# Patient Record
Sex: Male | Born: 1940
Health system: Southern US, Community
[De-identification: ages and names within clinical notes are randomized; demographics above are authoritative.]

## PROBLEM LIST (undated history)

## (undated) DIAGNOSIS — N400 Enlarged prostate without lower urinary tract symptoms: Secondary | ICD-10-CM

## (undated) DIAGNOSIS — I10 Essential (primary) hypertension: Secondary | ICD-10-CM

## (undated) HISTORY — PX: HERNIA REPAIR: SHX51

## (undated) HISTORY — PX: APPENDECTOMY: SHX54

---

## 2017-02-19 DIAGNOSIS — H0014 Chalazion left upper eyelid: Secondary | ICD-10-CM | POA: Diagnosis not present

## 2017-03-11 DIAGNOSIS — Z961 Presence of intraocular lens: Secondary | ICD-10-CM | POA: Diagnosis not present

## 2017-03-11 DIAGNOSIS — H0014 Chalazion left upper eyelid: Secondary | ICD-10-CM | POA: Diagnosis not present

## 2017-03-11 DIAGNOSIS — H04123 Dry eye syndrome of bilateral lacrimal glands: Secondary | ICD-10-CM | POA: Diagnosis not present

## 2017-04-14 DIAGNOSIS — I10 Essential (primary) hypertension: Secondary | ICD-10-CM | POA: Diagnosis not present

## 2017-04-14 DIAGNOSIS — E538 Deficiency of other specified B group vitamins: Secondary | ICD-10-CM | POA: Diagnosis not present

## 2017-04-14 DIAGNOSIS — M7551 Bursitis of right shoulder: Secondary | ICD-10-CM | POA: Diagnosis not present

## 2017-04-14 DIAGNOSIS — Z23 Encounter for immunization: Secondary | ICD-10-CM | POA: Diagnosis not present

## 2017-04-18 DIAGNOSIS — H0014 Chalazion left upper eyelid: Secondary | ICD-10-CM | POA: Diagnosis not present

## 2017-04-18 DIAGNOSIS — Z961 Presence of intraocular lens: Secondary | ICD-10-CM | POA: Diagnosis not present

## 2017-04-18 DIAGNOSIS — H0015 Chalazion left lower eyelid: Secondary | ICD-10-CM | POA: Diagnosis not present

## 2017-04-18 DIAGNOSIS — H04123 Dry eye syndrome of bilateral lacrimal glands: Secondary | ICD-10-CM | POA: Diagnosis not present

## 2017-05-01 DIAGNOSIS — H04123 Dry eye syndrome of bilateral lacrimal glands: Secondary | ICD-10-CM | POA: Diagnosis not present

## 2017-05-01 DIAGNOSIS — Z961 Presence of intraocular lens: Secondary | ICD-10-CM | POA: Diagnosis not present

## 2017-05-01 DIAGNOSIS — H0014 Chalazion left upper eyelid: Secondary | ICD-10-CM | POA: Diagnosis not present

## 2017-05-01 DIAGNOSIS — H0015 Chalazion left lower eyelid: Secondary | ICD-10-CM | POA: Diagnosis not present

## 2017-05-24 ENCOUNTER — Emergency Department (HOSPITAL_BASED_OUTPATIENT_CLINIC_OR_DEPARTMENT_OTHER)
Admission: EM | Admit: 2017-05-24 | Discharge: 2017-05-24 | Disposition: A | Discharge status: Home / Self Care | Payer: PPO | Attending: Emergency Medicine | Admitting: Emergency Medicine

## 2017-05-24 ENCOUNTER — Encounter (HOSPITAL_BASED_OUTPATIENT_CLINIC_OR_DEPARTMENT_OTHER): Payer: Self-pay | Admitting: Emergency Medicine

## 2017-05-24 ENCOUNTER — Other Ambulatory Visit: Payer: Self-pay

## 2017-05-24 ENCOUNTER — Emergency Department (HOSPITAL_BASED_OUTPATIENT_CLINIC_OR_DEPARTMENT_OTHER): Payer: PPO

## 2017-05-24 DIAGNOSIS — Z87891 Personal history of nicotine dependence: Secondary | ICD-10-CM | POA: Diagnosis not present

## 2017-05-24 DIAGNOSIS — Y998 Other external cause status: Secondary | ICD-10-CM | POA: Insufficient documentation

## 2017-05-24 DIAGNOSIS — S6991XA Unspecified injury of right wrist, hand and finger(s), initial encounter: Secondary | ICD-10-CM | POA: Diagnosis present

## 2017-05-24 DIAGNOSIS — W28XXXA Contact with powered lawn mower, initial encounter: Secondary | ICD-10-CM | POA: Diagnosis not present

## 2017-05-24 DIAGNOSIS — I10 Essential (primary) hypertension: Secondary | ICD-10-CM | POA: Diagnosis not present

## 2017-05-24 DIAGNOSIS — Y939 Activity, unspecified: Secondary | ICD-10-CM | POA: Insufficient documentation

## 2017-05-24 DIAGNOSIS — Y929 Unspecified place or not applicable: Secondary | ICD-10-CM | POA: Diagnosis not present

## 2017-05-24 DIAGNOSIS — Z79899 Other long term (current) drug therapy: Secondary | ICD-10-CM | POA: Insufficient documentation

## 2017-05-24 DIAGNOSIS — Z23 Encounter for immunization: Secondary | ICD-10-CM | POA: Diagnosis not present

## 2017-05-24 DIAGNOSIS — S61212A Laceration without foreign body of right middle finger without damage to nail, initial encounter: Secondary | ICD-10-CM | POA: Diagnosis not present

## 2017-05-24 HISTORY — DX: Benign prostatic hyperplasia without lower urinary tract symptoms: N40.0

## 2017-05-24 HISTORY — DX: Essential (primary) hypertension: I10

## 2017-05-24 MED ORDER — HYDROCODONE-ACETAMINOPHEN 5-325 MG PO TABS: 1.0000 | ORAL_TABLET | Freq: Four times a day (QID) | ORAL | 0 refills | Status: AC | PRN

## 2017-05-24 MED ORDER — TETANUS-DIPHTH-ACELL PERTUSSIS 5-2.5-18.5 LF-MCG/0.5 IM SUSP
0.5000 mL | Freq: Once | INTRAMUSCULAR | Status: AC
  Administered 2017-05-24: 0.5 mL via INTRAMUSCULAR
  Filled 2017-05-24: qty 0.5

## 2017-05-24 MED ORDER — BUPIVACAINE HCL (PF) 0.5 % IJ SOLN
20.0000 mL | Freq: Once | INTRAMUSCULAR | Status: AC
  Administered 2017-05-24: 20 mL
  Filled 2017-05-24: qty 20

## 2017-05-24 MED ORDER — TETANUS-DIPHTH-ACELL PERTUSSIS 5-2.5-18.5 LF-MCG/0.5 IM SUSP: 0.5000 mL | Freq: Once | INTRAMUSCULAR | Status: DC

## 2017-05-24 MED ORDER — IBUPROFEN 600 MG PO TABS: 600.0000 mg | ORAL_TABLET | Freq: Four times a day (QID) | ORAL | 0 refills | Status: AC | PRN

## 2017-05-24 NOTE — ED Provider Notes (Signed)
[facility] EMERGENCY DEPARTMENT Provider Note   CSN: [id number] Arrival date & time: 05/24/17  1514     History   Chief Complaint Chief Complaint  Patient presents with  . Laceration    HPI Bradley Moody is a 76 y.o. male past medical history hypertension presenting with laceration injury to the middle right finger after reaching under his lawnmower prior to arrival. Bleeding controlled, patient denies any other injuries.  Patient tetanus status was not up-to-date  HPI  Past Medical History:  Diagnosis Date  . BPH (benign prostatic hyperplasia)   . Hypertension     There are no active problems to display for this patient.   Past Surgical History:  Procedure Laterality Date  . APPENDECTOMY    . HERNIA REPAIR         Home Medications    Prior to Admission medications   Medication Sig Start Date End Date Taking? Authorizing Provider  LISINOPRIL PO Take by mouth.   Yes [provider]  HYDROcodone-acetaminophen (NORCO/VICODIN) 5-325 MG tablet Take 1 tablet every 6 (six) hours as needed by mouth for severe pain. 05/24/17   Emeline General, PA-C  ibuprofen (ADVIL,MOTRIN) 600 MG tablet Take 1 tablet (600 mg total) every 6 (six) hours as needed by mouth. 05/24/17   Emeline General, PA-C    Family History No family history on file.  Social History Social History   Tobacco Use  . Smoking status: Former Research scientist (life sciences)  . Smokeless tobacco: Never Used  Substance Use Topics  . Alcohol use: No    Frequency: Never  . Drug use: No     Allergies   Patient has no known allergies.   Review of Systems Review of Systems  Constitutional: Negative for chills and fever.  Gastrointestinal: Negative for nausea and vomiting.  Musculoskeletal: Positive for myalgias. Negative for arthralgias, joint swelling, neck pain and neck stiffness.  Skin: Positive for wound. Negative for color change, pallor and rash.  Neurological: Negative for dizziness,  syncope, weakness, light-headedness and numbness.     Physical Exam Updated Vital Signs BP 100/63 (BP Location: Left Arm)   Pulse 64   Temp 98.3 F (36.8 C) (Oral)   Resp 18   Ht 5\' 9"  (1.753 m)   Wt 68 kg (150 lb)   SpO2 99%   BMI 22.15 kg/m   Physical Exam  Constitutional: He appears well-developed and well-nourished. No distress.  Afebrile, nontoxic-appearing, sitting comfortably and in no acute distress. Bleeding controlled.  HENT:  Head: Normocephalic and atraumatic.  Eyes: EOM are normal.  Neck: Normal range of motion.  Cardiovascular: Normal rate, regular rhythm, normal heart sounds and intact distal pulses.  No murmur heard. Pulmonary/Chest: Effort normal and breath sounds normal. No stridor. No respiratory distress. He has no wheezes.  Musculoskeletal: Normal range of motion. He exhibits tenderness and deformity. He exhibits no edema.  Full range of motion of the finger.  Neurological: He is alert. No sensory deficit.  Skin: Skin is warm and dry. He is not diaphoretic. No pallor.  Stellate macerated laceration to the distal middle right finger. Patient still has sensation but not on the skin flap at the most distal point.  Psychiatric: He has a normal mood and affect.  Nursing note and vitals reviewed.        ED Treatments / Results  Labs (all labs ordered are listed, but only abnormal results are displayed) Labs Reviewed - No data to display  EKG  EKG Interpretation  None       Radiology Dg Finger Middle Right  Result Date: 05/24/2017 CLINICAL DATA:  Right middle finger laceration from lawnmower blade. EXAM: RIGHT MIDDLE FINGER 2+V COMPARISON:  None. FINDINGS: Soft tissue laceration at the tip of the middle finger. No radiopaque foreign body. No underlying osseous abnormality. Mild degenerative changes of the DIP joint. IMPRESSION: Soft tissue laceration at the tip of the middle finger. No radiopaque foreign body. No acute osseous abnormality.  Electronically Signed   By: Titus Dubin M.D.   On: 05/24/2017 17:02    Procedures Procedures (including critical care time)  NERVE BLOCK Performed by: Emeline General Consent: Verbal consent obtained. Required items: required blood products, implants, devices, and special equipment available Time out: Immediately prior to procedure a "time out" was called to verify the correct patient, procedure, equipment, support staff and site/side marked as required.  Indication: laceration repair Nerve block body site: right middle finger  Preparation: Patient was prepped and draped in the usual sterile fashion. Needle gauge: 24 G Location technique: anatomical landmarks  Local anesthetic: marcaine  Anesthetic total: 4 ml  Outcome: pain improved Patient tolerance: Patient tolerated the procedure well with no immediate complications.  LACERATION REPAIR Performed by: Emeline General Authorized by: Emeline General Consent: Verbal consent obtained. Risks and benefits: risks, benefits and alternatives were discussed Consent given by: patient Patient identity confirmed: provided demographic data Prepped and Draped in normal sterile fashion Wound explored  Laceration Location: right middle finger  Laceration Length: 2cm macerated see picture  No Foreign Bodies seen or palpated  Anesthesia: local infiltration digital block  Local anesthetic: marcaine 0.5%  Anesthetic total: 4 ml  Irrigation method: syringe Amount of cleaning: standard  Skin closure: 5.0 prolene  Number of sutures: 10  Technique: stellate corner (2), simple interrupted (8)  Patient tolerance: Patient tolerated the procedure well with no immediate complications. Medications Ordered in ED Medications  Tdap (BOOSTRIX) injection 0.5 mL (not administered)  Tdap (BOOSTRIX) injection 0.5 mL (0.5 mLs Intramuscular Given 05/24/17 1605)  bupivacaine (MARCAINE) 0.5 % injection 20 mL (20 mLs Infiltration  Given 05/24/17 1634)     Initial Impression / Assessment and Plan / ED Course  I have reviewed the triage vital signs and the nursing notes.  Pertinent labs & imaging results that were available during my care of the patient were reviewed by me and considered in my medical decision making (see chart for details).    Pressure irrigation performed. Wound explored and base of wound visualized in a bloodless field without evidence of foreign body.  Laceration occurred < 8 hours prior to repair which was well tolerated. Tdap updated.  Pt has no comorbidities to effect normal wound healing. Pt discharged  without antibiotics.    Discussed suture home care with patient and answered questions. Pt to follow-up for wound check and suture removal in 7 days; they are to return to the ED sooner for signs of infection. Pt is hemodynamically stable with no complaints prior to dc.   Discharge home with symptomatic relief and close follow-up with PCP. Discussed strict return precautions and advised to return to the emergency department if experiencing any new or worsening symptoms. Instructions were understood and patient agreed with discharge plan.  Final Clinical Impressions(s) / ED Diagnoses   Final diagnoses:  Laceration of right middle finger without foreign body without damage to nail, initial encounter    ED Discharge Orders        Ordered  HYDROcodone-acetaminophen (NORCO/VICODIN) 5-325 MG tablet  Every 6 hours PRN     05/24/17 1813    ibuprofen (ADVIL,MOTRIN) 600 MG tablet  Every 6 hours PRN     05/24/17 1813       Emeline General, PA-C 05/24/17 1817    Charlesetta Shanks, MD 05/25/17 [phone]

## 2017-05-24 NOTE — ED Notes (Addendum)
Pt given d/c instructions as per chart. Rx x 2 with precautions. Verbalizes understanding. No questions. 

## 2017-05-24 NOTE — Discharge Instructions (Signed)
As discussed, take ibuprofen for pain and Vicodin only for breakthrough severe pain. Keep the area clean and dry and monitor for any signs of infection.  Sutures will need to be removed in 7-10 days. Follow up with your primary care provider for suture removal.  Return sooner if pain increases, swelling, redness, warmth, purulence, fever, chills, or other new concerning symptoms in the meantime.

## 2017-05-24 NOTE — ED Triage Notes (Signed)
Pt c/o lac to RT middle finger via lawnmower blade today just PTA

## 2017-05-24 NOTE — ED Notes (Signed)
Laceration to tip of right middle finger is noted.  Cleanse with Saf-Cleans.  No active bleeding noted. Unable to measure site.

## 2017-05-28 DIAGNOSIS — Z5189 Encounter for other specified aftercare: Secondary | ICD-10-CM | POA: Diagnosis not present

## 2017-06-02 DIAGNOSIS — Z4802 Encounter for removal of sutures: Secondary | ICD-10-CM | POA: Diagnosis not present

## 2017-06-03 DIAGNOSIS — T8133XA Disruption of traumatic injury wound repair, initial encounter: Secondary | ICD-10-CM | POA: Diagnosis not present

## 2017-06-05 DIAGNOSIS — Z961 Presence of intraocular lens: Secondary | ICD-10-CM | POA: Diagnosis not present

## 2017-06-05 DIAGNOSIS — H0015 Chalazion left lower eyelid: Secondary | ICD-10-CM | POA: Diagnosis not present

## 2017-06-05 DIAGNOSIS — H0014 Chalazion left upper eyelid: Secondary | ICD-10-CM | POA: Diagnosis not present

## 2017-06-05 DIAGNOSIS — H04123 Dry eye syndrome of bilateral lacrimal glands: Secondary | ICD-10-CM | POA: Diagnosis not present

## 2017-06-10 DIAGNOSIS — Z5189 Encounter for other specified aftercare: Secondary | ICD-10-CM | POA: Diagnosis not present

## 2017-09-04 DIAGNOSIS — Z1211 Encounter for screening for malignant neoplasm of colon: Secondary | ICD-10-CM | POA: Diagnosis not present

## 2017-09-04 DIAGNOSIS — Z8371 Family history of colonic polyps: Secondary | ICD-10-CM | POA: Diagnosis not present

## 2017-09-04 DIAGNOSIS — D123 Benign neoplasm of transverse colon: Secondary | ICD-10-CM | POA: Diagnosis not present

## 2017-09-04 DIAGNOSIS — Z86010 Personal history of colonic polyps: Secondary | ICD-10-CM | POA: Diagnosis not present

## 2017-09-04 DIAGNOSIS — K5730 Diverticulosis of large intestine without perforation or abscess without bleeding: Secondary | ICD-10-CM | POA: Diagnosis not present

## 2017-09-04 DIAGNOSIS — K648 Other hemorrhoids: Secondary | ICD-10-CM | POA: Diagnosis not present

## 2017-10-31 DIAGNOSIS — I10 Essential (primary) hypertension: Secondary | ICD-10-CM | POA: Diagnosis not present

## 2017-10-31 DIAGNOSIS — E785 Hyperlipidemia, unspecified: Secondary | ICD-10-CM | POA: Diagnosis not present

## 2017-10-31 DIAGNOSIS — E538 Deficiency of other specified B group vitamins: Secondary | ICD-10-CM | POA: Diagnosis not present

## 2017-11-04 DIAGNOSIS — R972 Elevated prostate specific antigen [PSA]: Secondary | ICD-10-CM | POA: Diagnosis not present

## 2017-11-04 DIAGNOSIS — E785 Hyperlipidemia, unspecified: Secondary | ICD-10-CM | POA: Diagnosis not present

## 2017-11-04 DIAGNOSIS — N183 Chronic kidney disease, stage 3 (moderate): Secondary | ICD-10-CM | POA: Diagnosis not present

## 2017-11-04 DIAGNOSIS — D519 Vitamin B12 deficiency anemia, unspecified: Secondary | ICD-10-CM | POA: Diagnosis not present

## 2017-11-04 DIAGNOSIS — I129 Hypertensive chronic kidney disease with stage 1 through stage 4 chronic kidney disease, or unspecified chronic kidney disease: Secondary | ICD-10-CM | POA: Diagnosis not present

## 2017-11-04 DIAGNOSIS — R634 Abnormal weight loss: Secondary | ICD-10-CM | POA: Diagnosis not present

## 2017-12-01 DIAGNOSIS — H35363 Drusen (degenerative) of macula, bilateral: Secondary | ICD-10-CM | POA: Diagnosis not present

## 2017-12-01 DIAGNOSIS — H02883 Meibomian gland dysfunction of right eye, unspecified eyelid: Secondary | ICD-10-CM | POA: Diagnosis not present

## 2018-01-15 DIAGNOSIS — N183 Chronic kidney disease, stage 3 (moderate): Secondary | ICD-10-CM | POA: Diagnosis not present

## 2018-01-15 DIAGNOSIS — E785 Hyperlipidemia, unspecified: Secondary | ICD-10-CM | POA: Diagnosis not present

## 2018-01-15 DIAGNOSIS — Z0000 Encounter for general adult medical examination without abnormal findings: Secondary | ICD-10-CM | POA: Diagnosis not present

## 2018-01-15 DIAGNOSIS — D519 Vitamin B12 deficiency anemia, unspecified: Secondary | ICD-10-CM | POA: Diagnosis not present

## 2018-01-15 DIAGNOSIS — I129 Hypertensive chronic kidney disease with stage 1 through stage 4 chronic kidney disease, or unspecified chronic kidney disease: Secondary | ICD-10-CM | POA: Diagnosis not present

## 2018-03-20 DIAGNOSIS — L570 Actinic keratosis: Secondary | ICD-10-CM | POA: Diagnosis not present

## 2018-03-20 DIAGNOSIS — C44519 Basal cell carcinoma of skin of other part of trunk: Secondary | ICD-10-CM | POA: Diagnosis not present

## 2018-03-20 DIAGNOSIS — D225 Melanocytic nevi of trunk: Secondary | ICD-10-CM | POA: Diagnosis not present

## 2018-03-20 DIAGNOSIS — Z1283 Encounter for screening for malignant neoplasm of skin: Secondary | ICD-10-CM | POA: Diagnosis not present

## 2018-03-20 DIAGNOSIS — X32XXXD Exposure to sunlight, subsequent encounter: Secondary | ICD-10-CM | POA: Diagnosis not present

## 2018-03-26 DIAGNOSIS — K5792 Diverticulitis of intestine, part unspecified, without perforation or abscess without bleeding: Secondary | ICD-10-CM | POA: Diagnosis not present

## 2018-03-26 DIAGNOSIS — N420 Calculus of prostate: Secondary | ICD-10-CM | POA: Diagnosis not present

## 2018-03-26 DIAGNOSIS — N400 Enlarged prostate without lower urinary tract symptoms: Secondary | ICD-10-CM | POA: Diagnosis not present

## 2018-03-26 DIAGNOSIS — K5730 Diverticulosis of large intestine without perforation or abscess without bleeding: Secondary | ICD-10-CM | POA: Diagnosis not present

## 2018-03-26 DIAGNOSIS — R1110 Vomiting, unspecified: Secondary | ICD-10-CM | POA: Diagnosis not present

## 2018-03-26 DIAGNOSIS — R197 Diarrhea, unspecified: Secondary | ICD-10-CM | POA: Diagnosis not present

## 2018-03-26 DIAGNOSIS — N2889 Other specified disorders of kidney and ureter: Secondary | ICD-10-CM | POA: Diagnosis not present

## 2018-03-26 DIAGNOSIS — N3289 Other specified disorders of bladder: Secondary | ICD-10-CM | POA: Diagnosis not present

## 2018-03-26 DIAGNOSIS — K5732 Diverticulitis of large intestine without perforation or abscess without bleeding: Secondary | ICD-10-CM | POA: Diagnosis not present

## 2018-03-26 DIAGNOSIS — R1032 Left lower quadrant pain: Secondary | ICD-10-CM | POA: Diagnosis not present

## 2018-03-27 DIAGNOSIS — N179 Acute kidney failure, unspecified: Secondary | ICD-10-CM | POA: Diagnosis not present

## 2018-03-27 DIAGNOSIS — K5792 Diverticulitis of intestine, part unspecified, without perforation or abscess without bleeding: Secondary | ICD-10-CM | POA: Diagnosis not present

## 2018-03-27 DIAGNOSIS — I959 Hypotension, unspecified: Secondary | ICD-10-CM | POA: Diagnosis not present

## 2018-03-27 DIAGNOSIS — N183 Chronic kidney disease, stage 3 (moderate): Secondary | ICD-10-CM | POA: Diagnosis not present

## 2018-03-27 DIAGNOSIS — N281 Cyst of kidney, acquired: Secondary | ICD-10-CM | POA: Diagnosis not present

## 2018-03-27 DIAGNOSIS — N2889 Other specified disorders of kidney and ureter: Secondary | ICD-10-CM | POA: Diagnosis not present

## 2018-03-27 DIAGNOSIS — Z87891 Personal history of nicotine dependence: Secondary | ICD-10-CM | POA: Diagnosis not present

## 2018-03-27 DIAGNOSIS — N400 Enlarged prostate without lower urinary tract symptoms: Secondary | ICD-10-CM | POA: Diagnosis not present

## 2018-03-27 DIAGNOSIS — A029 Salmonella infection, unspecified: Secondary | ICD-10-CM | POA: Diagnosis not present

## 2018-03-27 DIAGNOSIS — E785 Hyperlipidemia, unspecified: Secondary | ICD-10-CM | POA: Diagnosis not present

## 2018-03-27 DIAGNOSIS — R531 Weakness: Secondary | ICD-10-CM | POA: Diagnosis not present

## 2018-03-27 DIAGNOSIS — E861 Hypovolemia: Secondary | ICD-10-CM | POA: Diagnosis not present

## 2018-03-27 DIAGNOSIS — E871 Hypo-osmolality and hyponatremia: Secondary | ICD-10-CM | POA: Diagnosis not present

## 2018-03-27 DIAGNOSIS — Z6820 Body mass index (BMI) 20.0-20.9, adult: Secondary | ICD-10-CM | POA: Diagnosis not present

## 2018-03-27 DIAGNOSIS — R197 Diarrhea, unspecified: Secondary | ICD-10-CM | POA: Diagnosis not present

## 2018-03-27 DIAGNOSIS — R1032 Left lower quadrant pain: Secondary | ICD-10-CM | POA: Diagnosis not present

## 2018-03-27 DIAGNOSIS — K5732 Diverticulitis of large intestine without perforation or abscess without bleeding: Secondary | ICD-10-CM | POA: Diagnosis not present

## 2018-03-27 DIAGNOSIS — Z833 Family history of diabetes mellitus: Secondary | ICD-10-CM | POA: Diagnosis not present

## 2018-03-27 DIAGNOSIS — R634 Abnormal weight loss: Secondary | ICD-10-CM | POA: Diagnosis not present

## 2018-03-27 DIAGNOSIS — Z820 Family history of epilepsy and other diseases of the nervous system: Secondary | ICD-10-CM | POA: Diagnosis not present

## 2018-03-27 DIAGNOSIS — I10 Essential (primary) hypertension: Secondary | ICD-10-CM | POA: Diagnosis not present

## 2018-03-27 DIAGNOSIS — I129 Hypertensive chronic kidney disease with stage 1 through stage 4 chronic kidney disease, or unspecified chronic kidney disease: Secondary | ICD-10-CM | POA: Diagnosis not present

## 2018-03-31 DIAGNOSIS — N179 Acute kidney failure, unspecified: Secondary | ICD-10-CM | POA: Diagnosis not present

## 2018-03-31 DIAGNOSIS — I129 Hypertensive chronic kidney disease with stage 1 through stage 4 chronic kidney disease, or unspecified chronic kidney disease: Secondary | ICD-10-CM | POA: Diagnosis not present

## 2018-03-31 DIAGNOSIS — N183 Chronic kidney disease, stage 3 (moderate): Secondary | ICD-10-CM | POA: Diagnosis not present

## 2018-03-31 DIAGNOSIS — K5732 Diverticulitis of large intestine without perforation or abscess without bleeding: Secondary | ICD-10-CM | POA: Diagnosis not present

## 2018-03-31 DIAGNOSIS — A029 Salmonella infection, unspecified: Secondary | ICD-10-CM | POA: Diagnosis not present

## 2018-03-31 DIAGNOSIS — N2889 Other specified disorders of kidney and ureter: Secondary | ICD-10-CM | POA: Diagnosis not present

## 2018-03-31 DIAGNOSIS — Z23 Encounter for immunization: Secondary | ICD-10-CM | POA: Diagnosis not present

## 2018-03-31 DIAGNOSIS — D649 Anemia, unspecified: Secondary | ICD-10-CM | POA: Diagnosis not present

## 2018-04-01 ENCOUNTER — Other Ambulatory Visit: Payer: Self-pay

## 2018-04-01 NOTE — Patient Outreach (Addendum)
[facility]) Care Management  04/01/2018  Bradley Moody 04-23-1941 [id number]     Transition of Care Referral  Referral Date: 04/01/18 Referral Source: HTA Discharge Report Date of Admission: unknown Diagnosis: unknown Date of Discharge: 03/30/18 Facility: [facility] Insurance: HTA    Outreach attempt # 1 to patient.  Spoke with spouse who voices that patient is doing "great." She states that he is currently out of town on vacation. He was cleared by MD to be able to travel and take a vacation. Spouse confirmed that patient has all his meds. His follow up appts are in place.  They are aware of how to contact MD for any changes in condition. RN C< unable to complete full TOC assessment. Spouse apprecative of call but voices patient doing well and has no needs or concerns at this time.     Plan: RN CM will close case at this time.    Enzo Montgomery, RN,BSN,CCM [place] Management Telephonic Care Management Coordinator Direct Phone: [phone] Toll Free: [phone] Fax: [phone]

## 2018-04-14 DIAGNOSIS — N2889 Other specified disorders of kidney and ureter: Secondary | ICD-10-CM | POA: Diagnosis not present

## 2018-04-14 DIAGNOSIS — N281 Cyst of kidney, acquired: Secondary | ICD-10-CM | POA: Diagnosis not present

## 2018-05-11 DIAGNOSIS — N400 Enlarged prostate without lower urinary tract symptoms: Secondary | ICD-10-CM | POA: Diagnosis not present

## 2018-05-11 DIAGNOSIS — N420 Calculus of prostate: Secondary | ICD-10-CM | POA: Diagnosis not present

## 2018-06-03 DIAGNOSIS — I129 Hypertensive chronic kidney disease with stage 1 through stage 4 chronic kidney disease, or unspecified chronic kidney disease: Secondary | ICD-10-CM | POA: Diagnosis not present

## 2018-06-03 DIAGNOSIS — I10 Essential (primary) hypertension: Secondary | ICD-10-CM | POA: Diagnosis not present

## 2018-06-03 DIAGNOSIS — E538 Deficiency of other specified B group vitamins: Secondary | ICD-10-CM | POA: Diagnosis not present

## 2018-06-03 DIAGNOSIS — I959 Hypotension, unspecified: Secondary | ICD-10-CM | POA: Diagnosis not present

## 2018-06-03 DIAGNOSIS — D649 Anemia, unspecified: Secondary | ICD-10-CM | POA: Diagnosis not present

## 2018-06-03 DIAGNOSIS — E785 Hyperlipidemia, unspecified: Secondary | ICD-10-CM | POA: Diagnosis not present

## 2018-06-03 DIAGNOSIS — N183 Chronic kidney disease, stage 3 (moderate): Secondary | ICD-10-CM | POA: Diagnosis not present

## 2018-06-03 DIAGNOSIS — R413 Other amnesia: Secondary | ICD-10-CM | POA: Diagnosis not present

## 2018-06-25 DIAGNOSIS — M542 Cervicalgia: Secondary | ICD-10-CM | POA: Diagnosis not present

## 2018-06-25 DIAGNOSIS — N183 Chronic kidney disease, stage 3 (moderate): Secondary | ICD-10-CM | POA: Diagnosis not present

## 2018-07-14 DIAGNOSIS — L570 Actinic keratosis: Secondary | ICD-10-CM | POA: Diagnosis not present

## 2018-07-14 DIAGNOSIS — Z85828 Personal history of other malignant neoplasm of skin: Secondary | ICD-10-CM | POA: Diagnosis not present

## 2018-07-14 DIAGNOSIS — Z08 Encounter for follow-up examination after completed treatment for malignant neoplasm: Secondary | ICD-10-CM | POA: Diagnosis not present

## 2018-07-14 DIAGNOSIS — X32XXXD Exposure to sunlight, subsequent encounter: Secondary | ICD-10-CM | POA: Diagnosis not present

## 2018-09-18 DIAGNOSIS — R4189 Other symptoms and signs involving cognitive functions and awareness: Secondary | ICD-10-CM | POA: Diagnosis not present

## 2018-09-19 DIAGNOSIS — R4189 Other symptoms and signs involving cognitive functions and awareness: Secondary | ICD-10-CM | POA: Diagnosis not present

## 2018-09-19 DIAGNOSIS — I10 Essential (primary) hypertension: Secondary | ICD-10-CM | POA: Diagnosis not present

## 2018-09-19 DIAGNOSIS — E785 Hyperlipidemia, unspecified: Secondary | ICD-10-CM | POA: Diagnosis not present

## 2018-09-19 DIAGNOSIS — E538 Deficiency of other specified B group vitamins: Secondary | ICD-10-CM | POA: Diagnosis not present

## 2018-10-07 DIAGNOSIS — R4189 Other symptoms and signs involving cognitive functions and awareness: Secondary | ICD-10-CM | POA: Diagnosis not present

## 2018-10-07 DIAGNOSIS — R413 Other amnesia: Secondary | ICD-10-CM | POA: Diagnosis not present

## 2018-10-07 DIAGNOSIS — R6883 Chills (without fever): Secondary | ICD-10-CM | POA: Diagnosis not present

## 2018-10-07 DIAGNOSIS — R5383 Other fatigue: Secondary | ICD-10-CM | POA: Diagnosis not present

## 2018-10-07 DIAGNOSIS — R918 Other nonspecific abnormal finding of lung field: Secondary | ICD-10-CM | POA: Diagnosis not present

## 2018-10-07 DIAGNOSIS — J9811 Atelectasis: Secondary | ICD-10-CM | POA: Diagnosis not present

## 2018-10-07 DIAGNOSIS — G319 Degenerative disease of nervous system, unspecified: Secondary | ICD-10-CM | POA: Diagnosis not present

## 2018-10-27 DIAGNOSIS — F09 Unspecified mental disorder due to known physiological condition: Secondary | ICD-10-CM | POA: Diagnosis not present

## 2018-12-23 DIAGNOSIS — R634 Abnormal weight loss: Secondary | ICD-10-CM | POA: Diagnosis not present

## 2018-12-23 DIAGNOSIS — F09 Unspecified mental disorder due to known physiological condition: Secondary | ICD-10-CM | POA: Diagnosis not present

## 2018-12-23 DIAGNOSIS — N183 Chronic kidney disease, stage 3 (moderate): Secondary | ICD-10-CM | POA: Diagnosis not present

## 2018-12-23 DIAGNOSIS — I129 Hypertensive chronic kidney disease with stage 1 through stage 4 chronic kidney disease, or unspecified chronic kidney disease: Secondary | ICD-10-CM | POA: Diagnosis not present

## 2018-12-23 DIAGNOSIS — E785 Hyperlipidemia, unspecified: Secondary | ICD-10-CM | POA: Diagnosis not present

## 2019-01-09 IMAGING — DX DG FINGER MIDDLE 2+V*R*
3 series · 3 of 3 positions shown · non-contrast
Comparison: None.

CLINICAL DATA: Right middle finger laceration from lawnmower blade.

EXAM:
RIGHT MIDDLE FINGER 2+V

[finger ap]
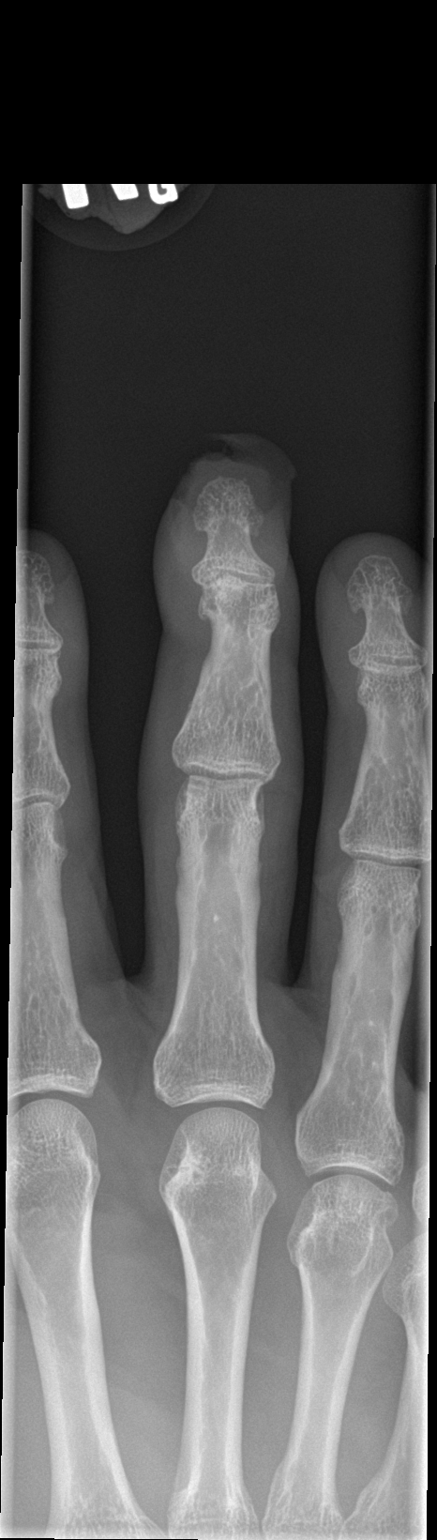

[finger obl]
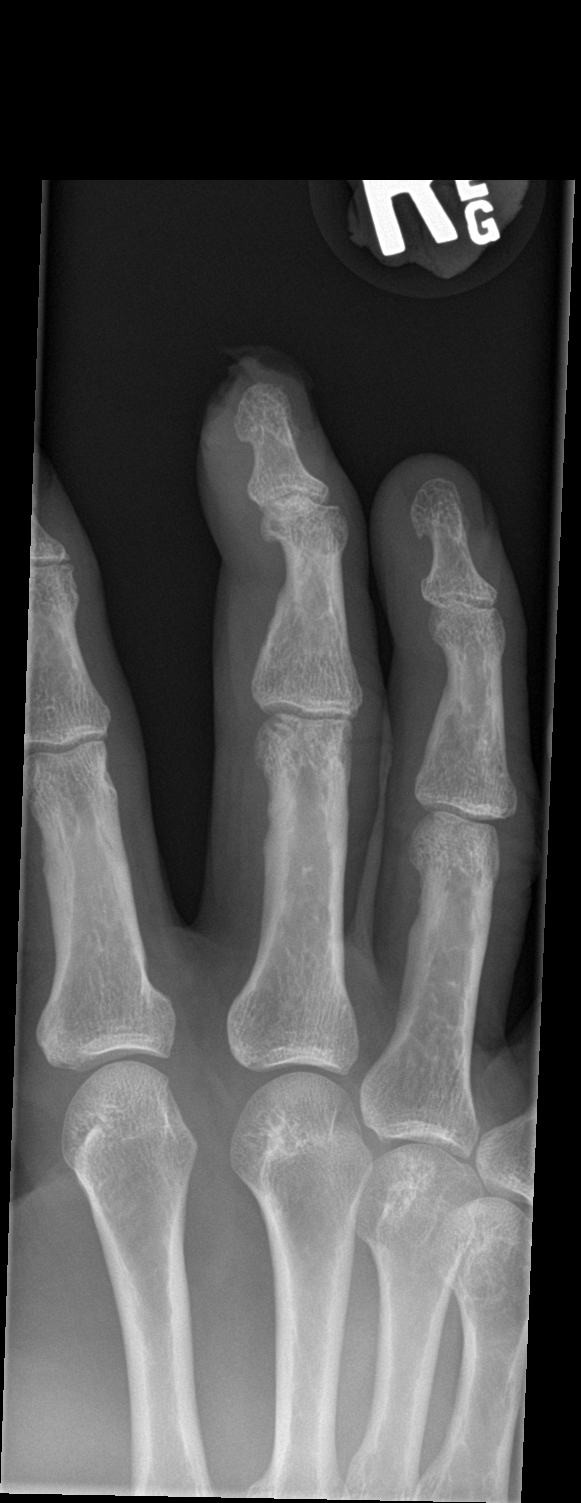

[finger lat]
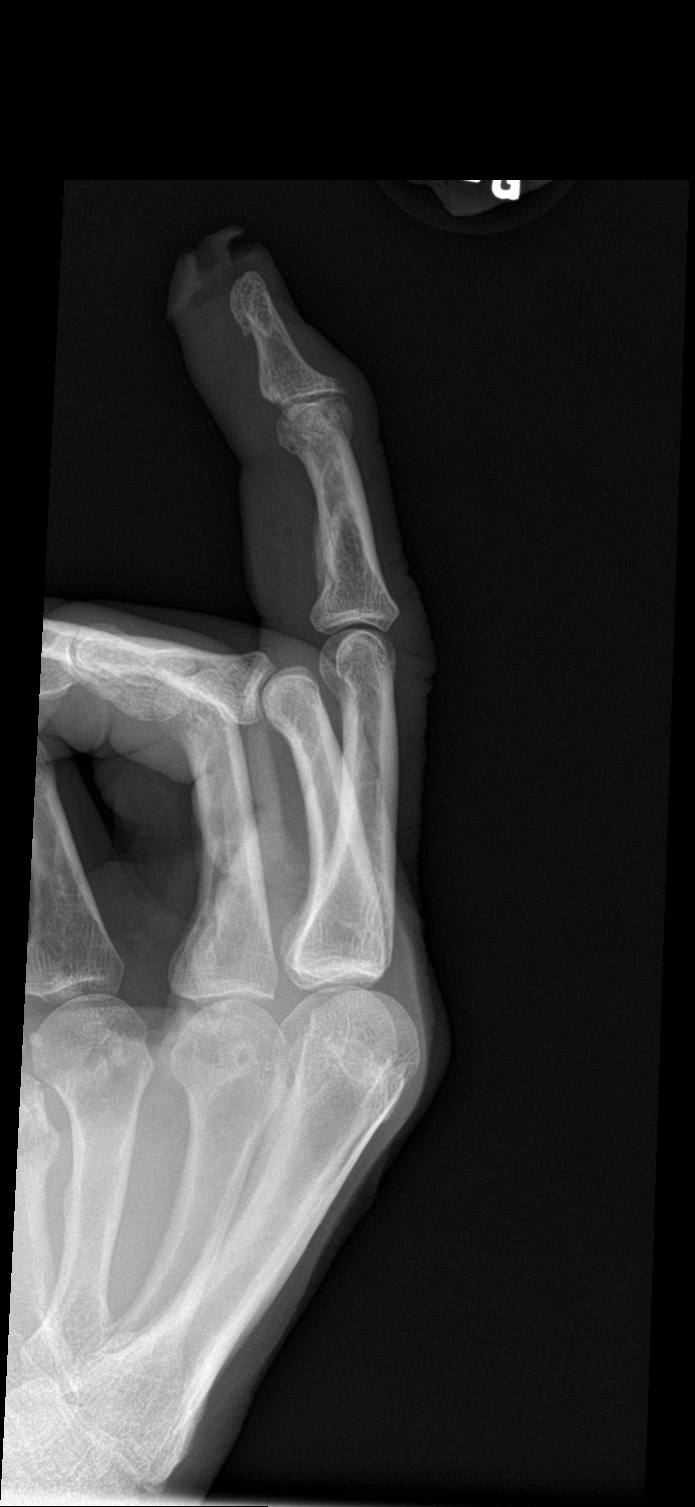

[3 of 3 positions shown; findings below may reference images not displayed]

FINDINGS: Soft tissue laceration at the tip of the middle finger. No
radiopaque foreign body. No underlying osseous abnormality. Mild
degenerative changes of the DIP joint.
IMPRESSION: Soft tissue laceration at the tip of the middle finger. No
radiopaque foreign body. No acute osseous abnormality.

## 2019-04-06 DIAGNOSIS — M7551 Bursitis of right shoulder: Secondary | ICD-10-CM | POA: Diagnosis not present

## 2019-04-06 DIAGNOSIS — R634 Abnormal weight loss: Secondary | ICD-10-CM | POA: Diagnosis not present

## 2019-04-06 DIAGNOSIS — F09 Unspecified mental disorder due to known physiological condition: Secondary | ICD-10-CM | POA: Diagnosis not present

## 2019-04-06 DIAGNOSIS — Z23 Encounter for immunization: Secondary | ICD-10-CM | POA: Diagnosis not present

## 2019-04-13 DIAGNOSIS — M7551 Bursitis of right shoulder: Secondary | ICD-10-CM | POA: Diagnosis not present

## 2019-05-05 DIAGNOSIS — N400 Enlarged prostate without lower urinary tract symptoms: Secondary | ICD-10-CM | POA: Diagnosis not present

## 2019-05-12 DIAGNOSIS — N400 Enlarged prostate without lower urinary tract symptoms: Secondary | ICD-10-CM | POA: Diagnosis not present

## 2019-07-13 DIAGNOSIS — E785 Hyperlipidemia, unspecified: Secondary | ICD-10-CM | POA: Diagnosis not present

## 2019-07-13 DIAGNOSIS — N1831 Chronic kidney disease, stage 3a: Secondary | ICD-10-CM | POA: Diagnosis not present

## 2019-07-13 DIAGNOSIS — E538 Deficiency of other specified B group vitamins: Secondary | ICD-10-CM | POA: Diagnosis not present

## 2019-07-15 DIAGNOSIS — E785 Hyperlipidemia, unspecified: Secondary | ICD-10-CM | POA: Diagnosis not present

## 2019-07-15 DIAGNOSIS — Z0000 Encounter for general adult medical examination without abnormal findings: Secondary | ICD-10-CM | POA: Diagnosis not present

## 2019-07-15 DIAGNOSIS — Z961 Presence of intraocular lens: Secondary | ICD-10-CM | POA: Diagnosis not present

## 2019-07-15 DIAGNOSIS — I700 Atherosclerosis of aorta: Secondary | ICD-10-CM | POA: Diagnosis not present

## 2019-07-15 DIAGNOSIS — R634 Abnormal weight loss: Secondary | ICD-10-CM | POA: Diagnosis not present

## 2019-07-15 DIAGNOSIS — I129 Hypertensive chronic kidney disease with stage 1 through stage 4 chronic kidney disease, or unspecified chronic kidney disease: Secondary | ICD-10-CM | POA: Diagnosis not present

## 2019-07-15 DIAGNOSIS — R4181 Age-related cognitive decline: Secondary | ICD-10-CM | POA: Diagnosis not present

## 2019-07-15 DIAGNOSIS — H04123 Dry eye syndrome of bilateral lacrimal glands: Secondary | ICD-10-CM | POA: Diagnosis not present

## 2019-07-15 DIAGNOSIS — N2889 Other specified disorders of kidney and ureter: Secondary | ICD-10-CM | POA: Diagnosis not present

## 2019-07-15 DIAGNOSIS — E559 Vitamin D deficiency, unspecified: Secondary | ICD-10-CM | POA: Diagnosis not present

## 2019-07-15 DIAGNOSIS — E538 Deficiency of other specified B group vitamins: Secondary | ICD-10-CM | POA: Diagnosis not present

## 2019-07-15 DIAGNOSIS — H0100A Unspecified blepharitis right eye, upper and lower eyelids: Secondary | ICD-10-CM | POA: Diagnosis not present

## 2019-07-15 DIAGNOSIS — N1831 Chronic kidney disease, stage 3a: Secondary | ICD-10-CM | POA: Diagnosis not present

## 2019-08-03 DIAGNOSIS — M810 Age-related osteoporosis without current pathological fracture: Secondary | ICD-10-CM | POA: Diagnosis not present

## 2019-08-03 DIAGNOSIS — Z13820 Encounter for screening for osteoporosis: Secondary | ICD-10-CM | POA: Diagnosis not present

## 2019-08-06 DIAGNOSIS — I10 Essential (primary) hypertension: Secondary | ICD-10-CM | POA: Diagnosis not present

## 2019-08-06 DIAGNOSIS — M810 Age-related osteoporosis without current pathological fracture: Secondary | ICD-10-CM | POA: Diagnosis not present

## 2019-08-17 DIAGNOSIS — D225 Melanocytic nevi of trunk: Secondary | ICD-10-CM | POA: Diagnosis not present

## 2019-08-17 DIAGNOSIS — Z1283 Encounter for screening for malignant neoplasm of skin: Secondary | ICD-10-CM | POA: Diagnosis not present

## 2019-08-17 DIAGNOSIS — D485 Neoplasm of uncertain behavior of skin: Secondary | ICD-10-CM | POA: Diagnosis not present

## 2019-08-17 DIAGNOSIS — C44311 Basal cell carcinoma of skin of nose: Secondary | ICD-10-CM | POA: Diagnosis not present

## 2019-09-14 DIAGNOSIS — C44311 Basal cell carcinoma of skin of nose: Secondary | ICD-10-CM | POA: Diagnosis not present

## 2020-02-03 DIAGNOSIS — D649 Anemia, unspecified: Secondary | ICD-10-CM | POA: Diagnosis not present

## 2020-02-03 DIAGNOSIS — N1831 Chronic kidney disease, stage 3a: Secondary | ICD-10-CM | POA: Diagnosis not present

## 2020-02-03 DIAGNOSIS — E538 Deficiency of other specified B group vitamins: Secondary | ICD-10-CM | POA: Diagnosis not present

## 2020-02-03 DIAGNOSIS — E785 Hyperlipidemia, unspecified: Secondary | ICD-10-CM | POA: Diagnosis not present

## 2020-02-07 DIAGNOSIS — I129 Hypertensive chronic kidney disease with stage 1 through stage 4 chronic kidney disease, or unspecified chronic kidney disease: Secondary | ICD-10-CM | POA: Diagnosis not present

## 2020-02-07 DIAGNOSIS — N1831 Chronic kidney disease, stage 3a: Secondary | ICD-10-CM | POA: Diagnosis not present

## 2020-02-07 DIAGNOSIS — D649 Anemia, unspecified: Secondary | ICD-10-CM | POA: Diagnosis not present

## 2020-02-07 DIAGNOSIS — E785 Hyperlipidemia, unspecified: Secondary | ICD-10-CM | POA: Diagnosis not present

## 2020-02-07 DIAGNOSIS — E559 Vitamin D deficiency, unspecified: Secondary | ICD-10-CM | POA: Diagnosis not present

## 2020-02-07 DIAGNOSIS — E538 Deficiency of other specified B group vitamins: Secondary | ICD-10-CM | POA: Diagnosis not present

## 2020-02-07 DIAGNOSIS — F09 Unspecified mental disorder due to known physiological condition: Secondary | ICD-10-CM | POA: Diagnosis not present

## 2020-02-07 DIAGNOSIS — R634 Abnormal weight loss: Secondary | ICD-10-CM | POA: Diagnosis not present

## 2020-03-14 DIAGNOSIS — Z08 Encounter for follow-up examination after completed treatment for malignant neoplasm: Secondary | ICD-10-CM | POA: Diagnosis not present

## 2020-03-14 DIAGNOSIS — D225 Melanocytic nevi of trunk: Secondary | ICD-10-CM | POA: Diagnosis not present

## 2020-03-14 DIAGNOSIS — D485 Neoplasm of uncertain behavior of skin: Secondary | ICD-10-CM | POA: Diagnosis not present

## 2020-03-14 DIAGNOSIS — Z1283 Encounter for screening for malignant neoplasm of skin: Secondary | ICD-10-CM | POA: Diagnosis not present

## 2020-03-14 DIAGNOSIS — X32XXXD Exposure to sunlight, subsequent encounter: Secondary | ICD-10-CM | POA: Diagnosis not present

## 2020-03-14 DIAGNOSIS — Z85828 Personal history of other malignant neoplasm of skin: Secondary | ICD-10-CM | POA: Diagnosis not present

## 2020-03-14 DIAGNOSIS — L570 Actinic keratosis: Secondary | ICD-10-CM | POA: Diagnosis not present

## 2020-06-13 DIAGNOSIS — N400 Enlarged prostate without lower urinary tract symptoms: Secondary | ICD-10-CM | POA: Diagnosis not present

## 2020-06-20 DIAGNOSIS — R972 Elevated prostate specific antigen [PSA]: Secondary | ICD-10-CM | POA: Diagnosis not present

## 2020-06-20 DIAGNOSIS — N400 Enlarged prostate without lower urinary tract symptoms: Secondary | ICD-10-CM | POA: Diagnosis not present

## 2020-07-14 DIAGNOSIS — H01002 Unspecified blepharitis right lower eyelid: Secondary | ICD-10-CM | POA: Diagnosis not present

## 2020-07-14 DIAGNOSIS — H04123 Dry eye syndrome of bilateral lacrimal glands: Secondary | ICD-10-CM | POA: Diagnosis not present

## 2020-07-14 DIAGNOSIS — Z961 Presence of intraocular lens: Secondary | ICD-10-CM | POA: Diagnosis not present

## 2020-07-14 DIAGNOSIS — H01001 Unspecified blepharitis right upper eyelid: Secondary | ICD-10-CM | POA: Diagnosis not present

## 2020-07-19 DIAGNOSIS — F09 Unspecified mental disorder due to known physiological condition: Secondary | ICD-10-CM | POA: Diagnosis not present

## 2020-07-19 DIAGNOSIS — R634 Abnormal weight loss: Secondary | ICD-10-CM | POA: Diagnosis not present

## 2020-07-19 DIAGNOSIS — Z23 Encounter for immunization: Secondary | ICD-10-CM | POA: Diagnosis not present

## 2020-07-19 DIAGNOSIS — M810 Age-related osteoporosis without current pathological fracture: Secondary | ICD-10-CM | POA: Diagnosis not present

## 2020-07-19 DIAGNOSIS — I10 Essential (primary) hypertension: Secondary | ICD-10-CM | POA: Diagnosis not present

## 2020-07-19 DIAGNOSIS — I129 Hypertensive chronic kidney disease with stage 1 through stage 4 chronic kidney disease, or unspecified chronic kidney disease: Secondary | ICD-10-CM | POA: Diagnosis not present

## 2020-07-19 DIAGNOSIS — E538 Deficiency of other specified B group vitamins: Secondary | ICD-10-CM | POA: Diagnosis not present

## 2020-07-19 DIAGNOSIS — E559 Vitamin D deficiency, unspecified: Secondary | ICD-10-CM | POA: Diagnosis not present

## 2020-07-19 DIAGNOSIS — I700 Atherosclerosis of aorta: Secondary | ICD-10-CM | POA: Diagnosis not present

## 2020-07-19 DIAGNOSIS — N1831 Chronic kidney disease, stage 3a: Secondary | ICD-10-CM | POA: Diagnosis not present

## 2020-07-19 DIAGNOSIS — D649 Anemia, unspecified: Secondary | ICD-10-CM | POA: Diagnosis not present

## 2020-07-19 DIAGNOSIS — Z0000 Encounter for general adult medical examination without abnormal findings: Secondary | ICD-10-CM | POA: Diagnosis not present

## 2020-07-19 DIAGNOSIS — E785 Hyperlipidemia, unspecified: Secondary | ICD-10-CM | POA: Diagnosis not present

## 2020-07-19 DIAGNOSIS — Z87891 Personal history of nicotine dependence: Secondary | ICD-10-CM | POA: Diagnosis not present

## 2020-10-19 DIAGNOSIS — K5900 Constipation, unspecified: Secondary | ICD-10-CM | POA: Diagnosis not present

## 2021-01-19 DIAGNOSIS — E538 Deficiency of other specified B group vitamins: Secondary | ICD-10-CM | POA: Diagnosis not present

## 2021-01-19 DIAGNOSIS — N1831 Chronic kidney disease, stage 3a: Secondary | ICD-10-CM | POA: Diagnosis not present

## 2021-01-22 DIAGNOSIS — N1831 Chronic kidney disease, stage 3a: Secondary | ICD-10-CM | POA: Diagnosis not present

## 2021-01-22 DIAGNOSIS — I129 Hypertensive chronic kidney disease with stage 1 through stage 4 chronic kidney disease, or unspecified chronic kidney disease: Secondary | ICD-10-CM | POA: Diagnosis not present

## 2021-01-22 DIAGNOSIS — E559 Vitamin D deficiency, unspecified: Secondary | ICD-10-CM | POA: Diagnosis not present

## 2021-01-22 DIAGNOSIS — Z87891 Personal history of nicotine dependence: Secondary | ICD-10-CM | POA: Diagnosis not present

## 2021-01-22 DIAGNOSIS — E538 Deficiency of other specified B group vitamins: Secondary | ICD-10-CM | POA: Diagnosis not present

## 2021-01-22 DIAGNOSIS — D631 Anemia in chronic kidney disease: Secondary | ICD-10-CM | POA: Diagnosis not present

## 2021-01-22 DIAGNOSIS — R7981 Abnormal blood-gas level: Secondary | ICD-10-CM | POA: Diagnosis not present

## 2021-01-22 DIAGNOSIS — F09 Unspecified mental disorder due to known physiological condition: Secondary | ICD-10-CM | POA: Diagnosis not present

## 2023-11-24 ENCOUNTER — Encounter (HOSPITAL_BASED_OUTPATIENT_CLINIC_OR_DEPARTMENT_OTHER): Payer: Self-pay

## 2023-11-24 ENCOUNTER — Other Ambulatory Visit: Payer: Self-pay

## 2023-11-24 ENCOUNTER — Emergency Department (HOSPITAL_BASED_OUTPATIENT_CLINIC_OR_DEPARTMENT_OTHER)

## 2023-11-24 ENCOUNTER — Emergency Department (HOSPITAL_BASED_OUTPATIENT_CLINIC_OR_DEPARTMENT_OTHER)
Admission: EM | Admit: 2023-11-24 | Discharge: 2023-11-25 | Disposition: A | Discharge status: Other Acute Inpatient Hospital | Attending: Emergency Medicine | Admitting: Emergency Medicine

## 2023-11-24 DIAGNOSIS — R109 Unspecified abdominal pain: Secondary | ICD-10-CM | POA: Diagnosis present

## 2023-11-24 DIAGNOSIS — K859 Acute pancreatitis without necrosis or infection, unspecified: Secondary | ICD-10-CM | POA: Insufficient documentation

## 2023-11-24 LAB — URINALYSIS, ROUTINE W REFLEX MICROSCOPIC
Bilirubin Urine: NEGATIVE
Glucose, UA: NEGATIVE mg/dL
Ketones, ur: NEGATIVE mg/dL
Leukocytes,Ua: NEGATIVE
Nitrite: NEGATIVE
Protein, ur: 100 mg/dL — AB
Specific Gravity, Urine: 1.025 (ref 1.005–1.030)
pH: 5.5 (ref 5.0–8.0)

## 2023-11-24 LAB — CBC
HCT: 42.8 % (ref 39.0–52.0)
Hemoglobin: 14.6 g/dL (ref 13.0–17.0)
MCH: 30.7 pg (ref 26.0–34.0)
MCHC: 34.1 g/dL (ref 30.0–36.0)
MCV: 90.1 fL (ref 80.0–100.0)
Platelets: 214 10*3/uL (ref 150–400)
RBC: 4.75 MIL/uL (ref 4.22–5.81)
RDW: 12.6 % (ref 11.5–15.5)
WBC: 17.5 10*3/uL — ABNORMAL HIGH (ref 4.0–10.5)
nRBC: 0 % (ref 0.0–0.2)

## 2023-11-24 LAB — COMPREHENSIVE METABOLIC PANEL WITH GFR
ALT: 34 U/L (ref 0–44)
AST: 36 U/L (ref 15–41)
Albumin: 4.5 g/dL (ref 3.5–5.0)
Alkaline Phosphatase: 78 U/L (ref 38–126)
Anion gap: 15 (ref 5–15)
BUN: 37 mg/dL — ABNORMAL HIGH (ref 8–23)
CO2: 25 mmol/L (ref 22–32)
Calcium: 9.3 mg/dL (ref 8.9–10.3)
Chloride: 101 mmol/L (ref 98–111)
Creatinine, Ser: 1.41 mg/dL — ABNORMAL HIGH (ref 0.61–1.24)
GFR, Estimated: 50 mL/min — ABNORMAL LOW (ref >60)
Glucose, Bld: 151 mg/dL — ABNORMAL HIGH (ref 70–99)
Potassium: 3.7 mmol/L (ref 3.5–5.1)
Sodium: 141 mmol/L (ref 135–145)
Total Bilirubin: 0.6 mg/dL (ref 0.0–1.2)
Total Protein: 7.1 g/dL (ref 6.5–8.1)

## 2023-11-24 LAB — LIPASE, BLOOD: Lipase: 1261 U/L — ABNORMAL HIGH (ref 11–51)

## 2023-11-24 LAB — URINALYSIS, MICROSCOPIC (REFLEX): WBC, UA: NONE SEEN WBC/hpf (ref 0–5)

## 2023-11-24 MED ORDER — ONDANSETRON HCL 4 MG/2ML IJ SOLN
4.0000 mg | Freq: Once | INTRAMUSCULAR | Status: AC
  Administered 2023-11-24: 4 mg via INTRAVENOUS
  Filled 2023-11-24: qty 2

## 2023-11-24 MED ORDER — IOHEXOL 300 MG/ML  SOLN
100.0000 mL | Freq: Once | INTRAMUSCULAR | Status: AC | PRN
  Administered 2023-11-24: 100 mL via INTRAVENOUS

## 2023-11-24 MED ORDER — LACTATED RINGERS IV BOLUS
1000.0000 mL | Freq: Once | INTRAVENOUS | Status: AC
  Administered 2023-11-24: 1000 mL via INTRAVENOUS

## 2023-11-24 MED ORDER — FENTANYL CITRATE PF 50 MCG/ML IJ SOSY
50.0000 ug | PREFILLED_SYRINGE | Freq: Once | INTRAMUSCULAR | Status: AC
  Administered 2023-11-24: 50 ug via INTRAVENOUS
  Filled 2023-11-24: qty 1

## 2023-11-24 NOTE — ED Notes (Signed)
 Pt has dementia, hx obtained from son. Pt has had n/v all day and has c/o abdominal pain generalized  Has no complaints at this time Vomited approx 1 hr ago Has no hx of gall bladder issues

## 2023-11-24 NOTE — ED Notes (Signed)
Pt ambulated to restroom to obtain urine specimen.

## 2023-11-24 NOTE — ED Triage Notes (Signed)
 Pt arrives with c/o n/v that started yesterday. Per family, pt did endorse ABD pain last night.

## 2023-11-24 NOTE — ED Provider Notes (Signed)
 [facility] EMERGENCY DEPARTMENT AT [facility]  Provider Note  CSN: [id number] Arrival date & time: 11/24/23 2008  History Chief Complaint  Patient presents with   Emesis    Bradley Moody is a 83 y.o. male with history of dementia brought to the ED by son for evaluation of 24 hours of persistent vomiting and intermittent abdominal pain. Patient has been living independently with full time family help until recently when his caregiver moved. Son and other family have been helping him off and on the last week. He felt warm yesterday but no measured fever.    Home Medications Prior to Admission medications   Medication Sig Start Date End Date Taking? Authorizing Provider  HYDROcodone -acetaminophen  (NORCO/VICODIN) 5-325 MG tablet Take 1 tablet every 6 (six) hours as needed by mouth for severe pain. 05/24/17   Mitchell, Jessica B, PA-C  ibuprofen  (ADVIL ,MOTRIN ) 600 MG tablet Take 1 tablet (600 mg total) every 6 (six) hours as needed by mouth. 05/24/17   Alejandro Amour B, PA-C  LISINOPRIL PO Take by mouth.    [provider]     Allergies    Patient has no known allergies.   Review of Systems   Review of Systems Please see HPI for pertinent positives and negatives  Physical Exam BP (!) 148/72   Pulse 64   Temp 98.3 F (36.8 C)   Resp 16   Wt 68 kg   SpO2 96%   BMI 22.15 kg/m   Physical Exam Vitals and nursing note reviewed.  Constitutional:      Appearance: Normal appearance.  HENT:     Head: Normocephalic and atraumatic.     Nose: Nose normal.     Mouth/Throat:     Mouth: Mucous membranes are dry.  Eyes:     Extraocular Movements: Extraocular movements intact.     Conjunctiva/sclera: Conjunctivae normal.  Cardiovascular:     Rate and Rhythm: Normal rate.  Pulmonary:     Effort: Pulmonary effort is normal.     Breath sounds: Normal breath sounds.  Abdominal:     General: Abdomen is flat.     Palpations: Abdomen is soft.     Tenderness:  There is abdominal tenderness (epigastric). There is guarding.  Musculoskeletal:        General: No swelling. Normal range of motion.     Cervical back: Neck supple.  Skin:    General: Skin is warm and dry.  Neurological:     General: No focal deficit present.     Mental Status: He is alert.  Psychiatric:        Mood and Affect: Mood normal.     ED Results / Procedures / Treatments   EKG None  Procedures Procedures  Medications Ordered in the ED Medications  fentaNYL  (SUBLIMAZE ) injection 50 mcg (50 mcg Intravenous Given 11/24/23 2327)  ondansetron  (ZOFRAN ) injection 4 mg (4 mg Intravenous Given 11/24/23 2324)  lactated ringers  bolus 1,000 mL (0 mLs Intravenous Stopped 11/25/23 0152)  iohexol  (OMNIPAQUE ) 300 MG/ML solution 100 mL (100 mLs Intravenous Contrast Given 11/24/23 2341)    Initial Impression and Plan  Patient here with epigastric pain, vomiting since yesterday.  Labs done in triage show CBC with leukocytosis, CMP with mildly elevated Cr, similar to baseline. Lipase is significantly elevated. No prior history of pancreatitis. Son denies any EtOH use. Will check a CT, pain/nausea meds. Anticipate admission.   ED Course   Clinical Course as of 11/25/23 0358  Mon Nov 24, 2023  2307 UA without signs of infection.  [CS]  Tue Nov 25, 2023  0003 I personally viewed the images from radiology studies and agree with radiologist interpretation: CT confirmed pancreatitis, no complications. Patient and family prefer admission to [facility], will call and see if they have capacity to take him there.  [CS]  0037 Per [facility] Transfer center, I needed to speak with GI at [facility] before they would accept the patient. I spoke with Dr. Corey Dial who agrees this patient is appropriate to come to [facility].  [CS]  0100 Spoke with Dr. Haskell Linker, Hospitalist, who will accept the patient for transfer to [facility].  [CS]    Clinical Course User Index [CS] Charmayne Cooper, MD     MDM  Rules/Calculators/A&P Medical Decision Making Problems Addressed: Acute pancreatitis without infection or necrosis, unspecified pancreatitis type: acute illness or injury  Amount and/or Complexity of Data Reviewed Labs: ordered. Decision-making details documented in ED Course. Radiology: ordered and independent interpretation performed. Decision-making details documented in ED Course.  Risk Prescription drug management. Parenteral controlled substances.     Final Clinical Impression(s) / ED Diagnoses Final diagnoses:  Acute pancreatitis without infection or necrosis, unspecified pancreatitis type    Rx / DC Orders ED Discharge Orders     None        Charmayne Cooper, MD 11/25/23 [phone]

## 2023-11-25 NOTE — ED Notes (Signed)
-  Patient accepting to [facility] by Dr. Quilla Brunswick -- Room 763 - Report # [phone].

## 2023-11-25 NOTE — ED Notes (Addendum)
-  Called [facility] line at 1205am for hospitalist consult to [facility].
